# Patient Record
Sex: Female | Born: 1970 | Race: White | Hispanic: No | Marital: Married
Health system: Southern US, Community
[De-identification: ages and names within clinical notes are randomized; demographics above are authoritative.]

---

## 2005-02-17 ENCOUNTER — Encounter (HOSPITAL_COMMUNITY): Admission: RE | Admit: 2005-02-17 | Discharge: 2005-03-19 | Payer: Self-pay | Admitting: Endocrinology

## 2005-03-06 ENCOUNTER — Ambulatory Visit (HOSPITAL_COMMUNITY): Admission: RE | Admit: 2005-03-06 | Discharge: 2005-03-06 | Payer: Self-pay | Admitting: *Deleted

## 2005-03-30 ENCOUNTER — Encounter: Admission: RE | Admit: 2005-03-30 | Discharge: 2005-03-30 | Payer: Self-pay | Admitting: Neurology

## 2007-03-18 ENCOUNTER — Ambulatory Visit (HOSPITAL_COMMUNITY): Admission: RE | Admit: 2007-03-18 | Discharge: 2007-03-18 | Payer: Self-pay | Admitting: Orthopedic Surgery

## 2018-04-30 ENCOUNTER — Other Ambulatory Visit: Payer: Self-pay | Admitting: Otolaryngology

## 2018-04-30 ENCOUNTER — Other Ambulatory Visit (INDEPENDENT_AMBULATORY_CARE_PROVIDER_SITE_OTHER): Payer: Self-pay | Admitting: Otolaryngology

## 2018-04-30 DIAGNOSIS — H93A2 Pulsatile tinnitus, left ear: Secondary | ICD-10-CM

## 2018-05-21 ENCOUNTER — Ambulatory Visit
Admission: RE | Admit: 2018-05-21 | Discharge: 2018-05-21 | Disposition: A | Discharge status: Home / Self Care | Payer: BC Managed Care – PPO | Source: Ambulatory Visit | Attending: Otolaryngology | Admitting: Otolaryngology

## 2018-05-21 DIAGNOSIS — H93A2 Pulsatile tinnitus, left ear: Secondary | ICD-10-CM

## 2018-05-21 MED ORDER — GADOBENATE DIMEGLUMINE 529 MG/ML IV SOLN
15.0000 mL | Freq: Once | INTRAVENOUS | Status: AC | PRN
  Administered 2018-05-21: 15 mL via INTRAVENOUS

## 2019-10-15 IMAGING — MR MR HEAD WO/W CM
11 of 12 series · 37 of 48 positions shown · IV contrast (15 ML MULTIHANCE)
Comparison: 03/30/2005. Report from examination 03/16/2011, actual
images not available.

CLINICAL DATA: Headache. Abnormal sounds in the left ear. Mass
inferior to the ear.

EXAM:
MRI HEAD WITHOUT AND WITH CONTRAST
TECHNIQUE: Multiplanar, multiecho pulse sequences of the brain and surrounding
structures were obtained without and with intravenous contrast.
CONTRAST:  15mL MULTIHANCE GADOBENATE DIMEGLUMINE 529 MG/ML IV SOLN

[Series 2: T1 · sagittal · 5.0mm · 0.45mm/px · 3 of 21 slices shown (1 of 4)]
[im 1/21]
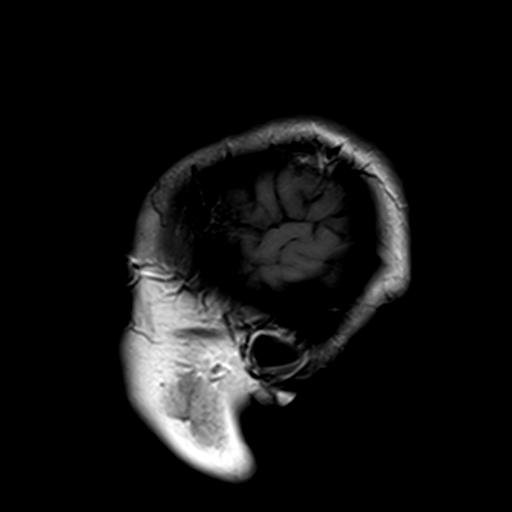
[im 11/21]
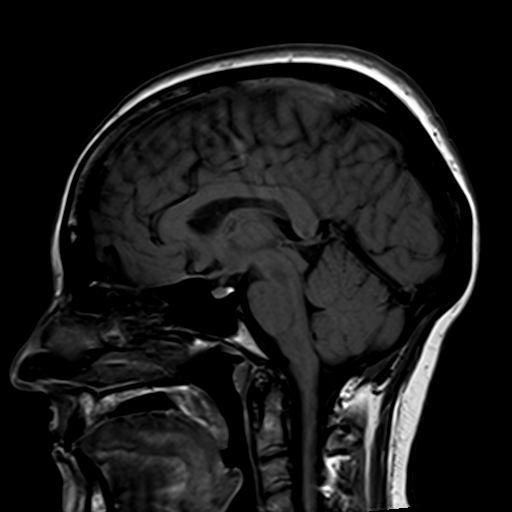
[im 21/21]
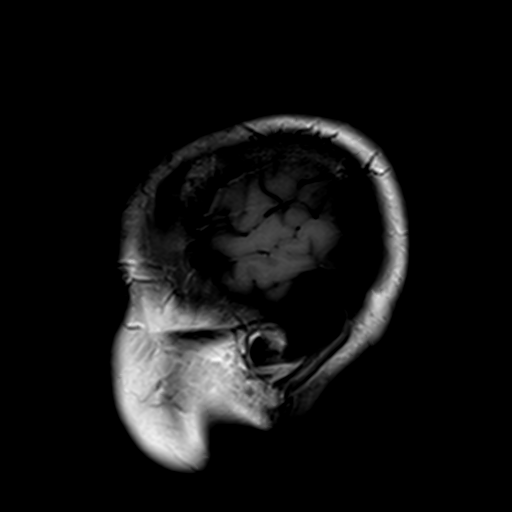

[Series 3: ep2d_diff_(id)_trace · axial · 3.0mm · 1.80mm/px · z∈[-47,+100]mm · 8 of 99 slices shown]
[im 1/99]
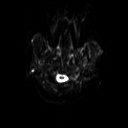
[im 11/99]
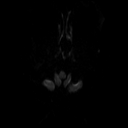
[im 33/99]
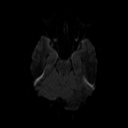
[im 44/99]
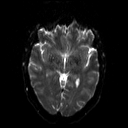
[im 55/99]
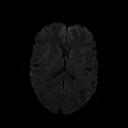
[im 66/99]
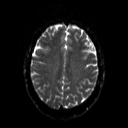
[im 88/99]
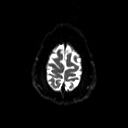
[im 99/99]
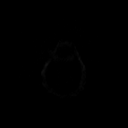

[Series 4: ep2d_diff_(id)_trace_adc · axial · 3.0mm · 1.80mm/px · z∈[-47,+100]mm · 5 of 50 slices shown]
[im 1/50]
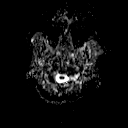
[im 13/50]
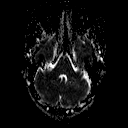
[im 25/50]
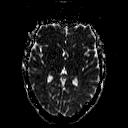
[im 37/50]
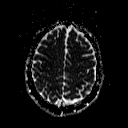
[im 50/50]
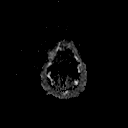

[Series 5: t2_tse_tra_512 · axial · 5.0mm · 0.72mm/px · z∈[-61,+95]mm · 3 of 26 slices shown]
[im 1/26]
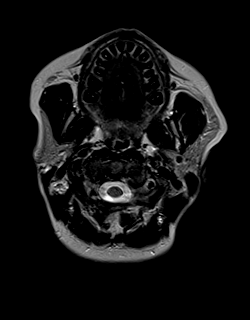
[im 13/26]
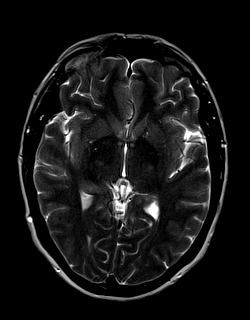
[im 26/26]
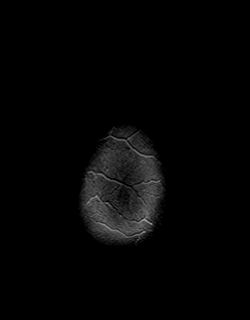

[Series 7: swi_images · axial · 2.0mm · 0.90mm/px · z∈[-62,+96]mm · 8 of 80 slices shown]
[im 1/80]
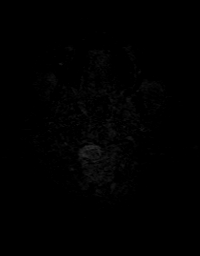
[im 12/80]
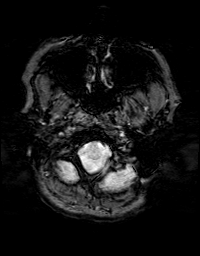
[im 23/80]
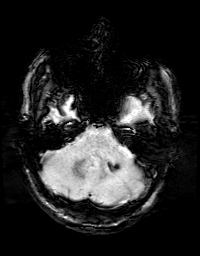
[im 34/80]
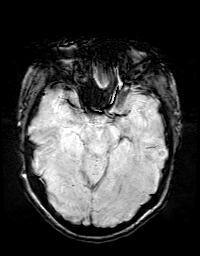
[im 46/80]
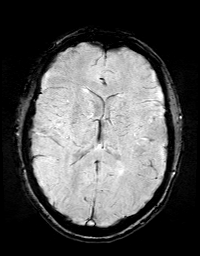
[im 57/80]
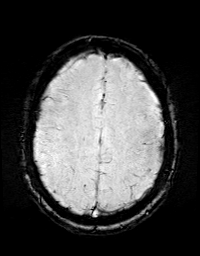
[im 68/80]
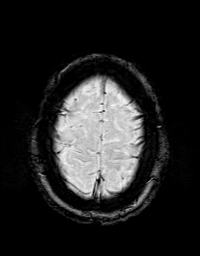
[im 80/80]
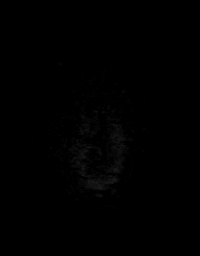

[Series 8: FLAIR · axial · 3.0mm · 0.43mm/px · z∈[-61,+95]mm · 3 of 27 slices shown]
[im 1/27]
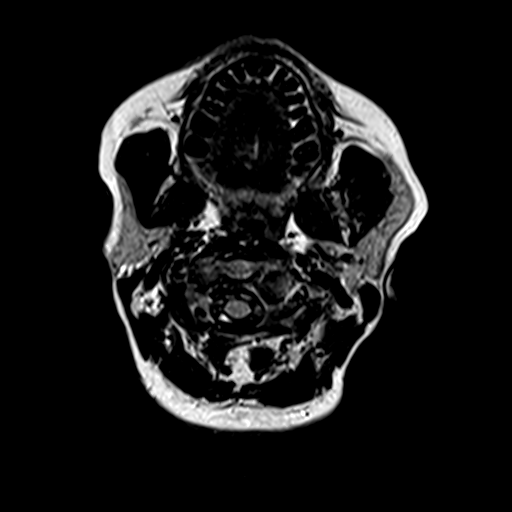
[im 14/27]
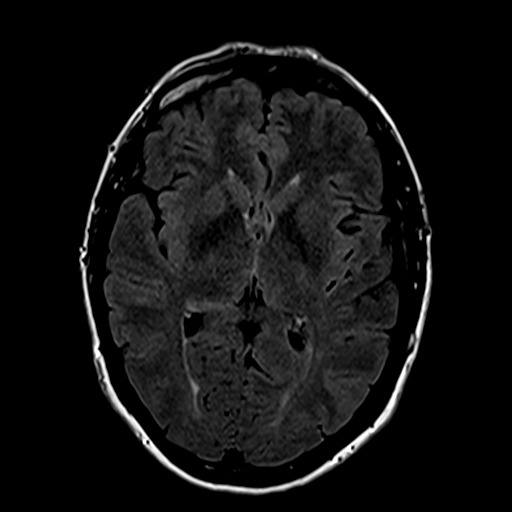
[im 27/27]
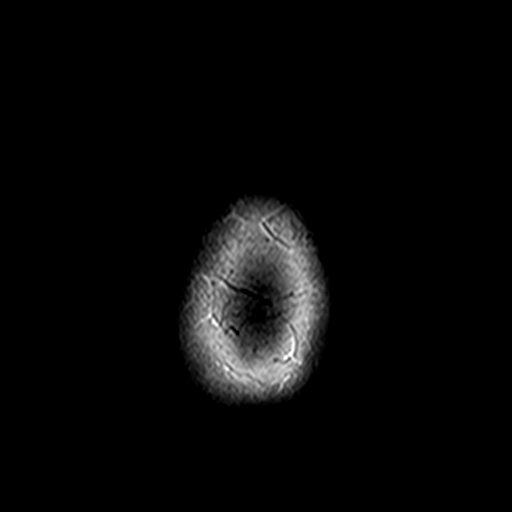

[Series 9: T1 · coronal · 3.0mm · 0.35mm/px · 1 of 13 slices shown (2 of 4)]
[im 1/13]
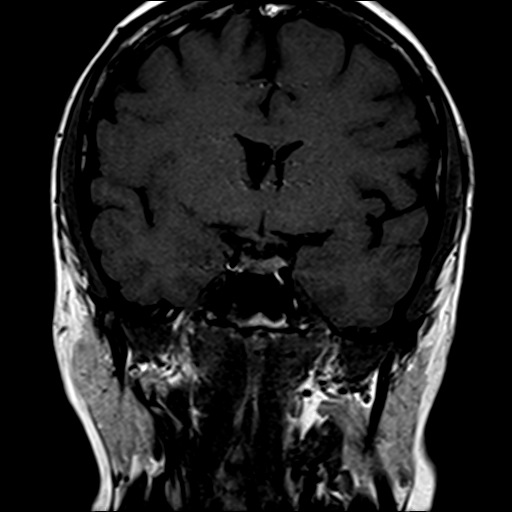

[Series 10: T1 · axial · 3.0mm · 0.35mm/px · 1 of 11 slices shown (3 of 4)]
[im 1/11]
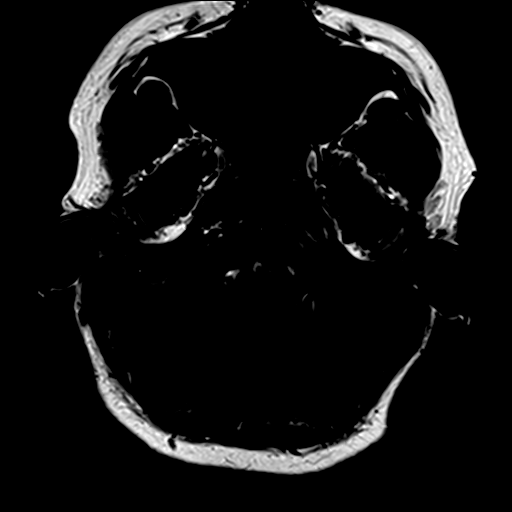

[Series 11: bSSFP · axial · 0.7mm · 0.28mm/px · z∈[-30,-10]mm · 3 of 44 slices shown]
[im 1/44]
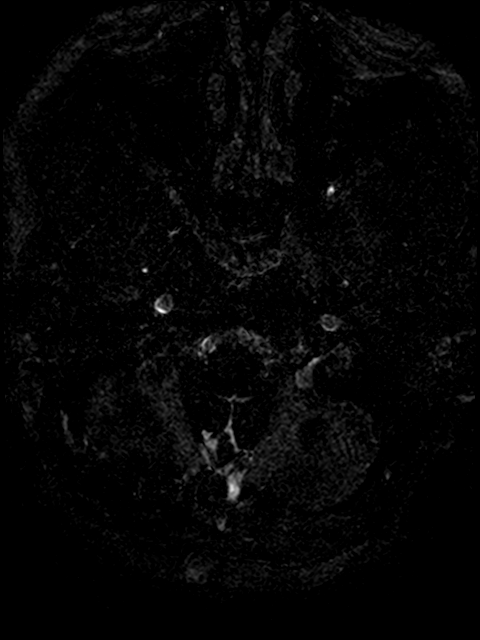
[im 15/44]
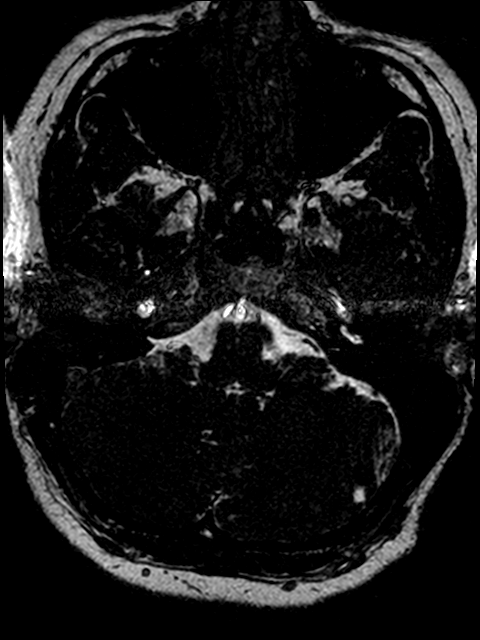
[im 29/44]
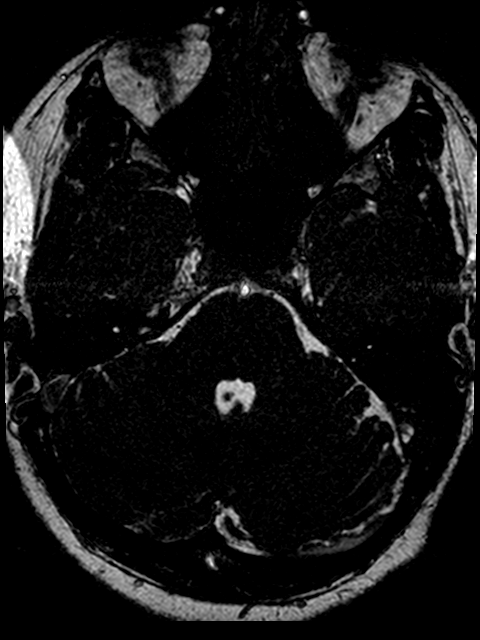

[Series 12: T1 · coronal · 3.0mm · 0.35mm/px · 1 of 13 slices shown (4 of 4)]
[im 1/13]
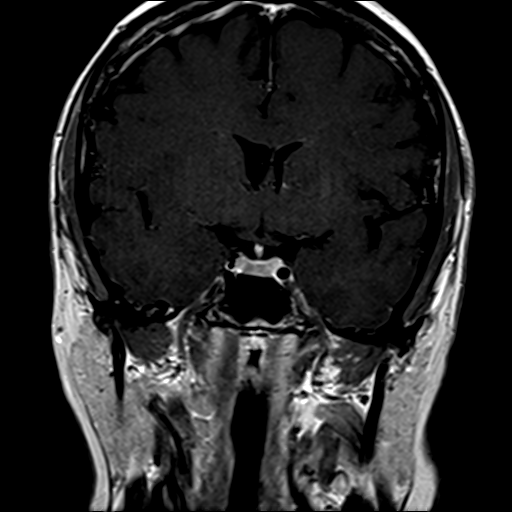

[Series 13: T1 post-contrast · axial · 3.0mm · 0.35mm/px · 1 of 11 slices shown]
[im 1/11]
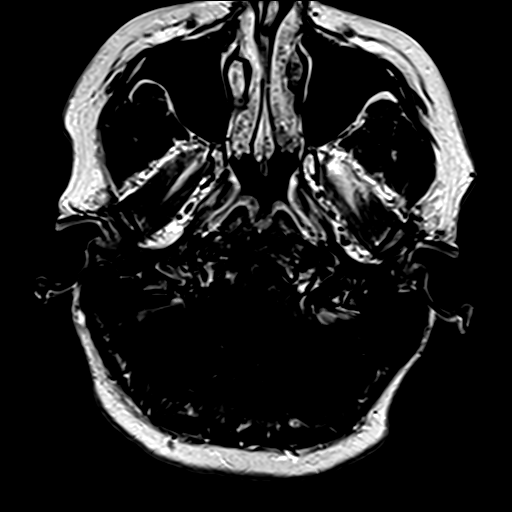

[37 of 48 positions shown; findings below may reference images not displayed]

FINDINGS: Brain: Diffusion imaging does not show any acute or subacute
infarction. The brainstem is normal. Within the left cerebellar
hemisphere and medial portion of the medial cerebellar peduncle on
the left, there is an approximately 14-16 mm region of low level
increased T2 and FLAIR signal, susceptibility artifact, very
low-grade enhancement, and central vascular structure or scar most
consistent with a chronic developmental venous anomaly. This does
not appear significantly changed or progressed when compared to the
study of 2005. If anything, it may measure a mm or 2 smaller.
Therefore, this does not represent any sort of growing tumor or
high-grade vascular malformation. Cerebral hemispheres are normal
except for low level T2 and FLAIR signal within the deep white
matter adjacent to the atria of the lateral ventricles, unchanged
and not likely to be significant. No cortical abnormality. No mass
lesion, acute hemorrhage, hydrocephalus or extra-axial collection.
No evidence of vestibular schwannoma or temporal bone abnormality.

Vascular: Major arterial and venous structures at the base of the
brain show flow. Common arachnoid granulations within the transverse
sinus on the left.

Skull and upper cervical spine: Negative

Sinuses/Orbits: Clear/normal

Other: None. I do not see any evidence of mass underneath the ear as
described in the clinical history.
IMPRESSION: No acute finding. No apparent change since the study of 2005 or the
report of 8908. Probable developmental venous anomaly at the
junction of the left cerebellar hemisphere and medial aspect of the
middle cerebellar peduncle. This does not show any enlargement or
worrisome change since 2005. If anything, it actually [DATE]
or 2 mm smaller, suggesting that there could have been some mild
swelling at the time of the scan of 2005. There is susceptibility
artifact within the lesion consistent with chronic microhemorrhage.
No evidence of acute or large hemorrhage. It is not clear that this
would relate to the patient's clinical presentation. No large or
medium size arterial or venous abnormality seen to explain the
clinical presentation.

## 2022-11-23 ENCOUNTER — Emergency Department (HOSPITAL_COMMUNITY): Payer: BC Managed Care – PPO

## 2022-11-23 ENCOUNTER — Emergency Department (HOSPITAL_COMMUNITY)
Admission: EM | Admit: 2022-11-23 | Discharge: 2022-11-23 | Disposition: A | Discharge status: Home / Self Care | Payer: BC Managed Care – PPO | Attending: Emergency Medicine | Admitting: Emergency Medicine

## 2022-11-23 ENCOUNTER — Other Ambulatory Visit: Payer: Self-pay

## 2022-11-23 ENCOUNTER — Emergency Department (HOSPITAL_BASED_OUTPATIENT_CLINIC_OR_DEPARTMENT_OTHER)
Admission: EM | Admit: 2022-11-23 | Discharge: 2022-11-23 | Discharge status: Absent without leave | Payer: BC Managed Care – PPO | Source: Home / Self Care

## 2022-11-23 DIAGNOSIS — M25561 Pain in right knee: Secondary | ICD-10-CM

## 2022-11-23 MED ORDER — HYDROCODONE-ACETAMINOPHEN 5-325 MG PO TABS: 2.0000 | ORAL_TABLET | ORAL | 0 refills | Status: AC | PRN

## 2022-11-23 NOTE — ED Provider Notes (Signed)
[place] EMERGENCY DEPARTMENT AT [facility] Provider Note   CSN: [id number] Arrival date & time: 11/23/22  1604     History  Chief Complaint  Patient presents with   rt knee pain    Chelsey Suarez is a 52 y.o. female.  52 year old female with prior medical history as detailed below presents for evaluation.  Patient reports that she injured her right knee 2 days ago.  She reports that she was at home and then felt that her right kneecap moved upward and this caused severe pain.  Her husband was able to help her and the kneecap relocated itself.  She subsequently saw [facility] who performed x-rays and told her that she had no fracture.  She was given a soft knee splint.  Today as she was performing several errands throughout the course of the day prior to a upcoming beach trip she developed increased pain to the lateral aspect of the right knee.  She reports increased pain with movement.  She took some ibuprofen prior to arrival.  She is primarily requesting an MRI of her knee.  She is here at the [facility] long ED as MRI capability until 7 PM.  She feels that an MRI would help her have clarity as to what is causing her pain.  In fact, she did not leave on time this afternoon to get down to the beach for her weeklong beach vacation as has been planned.  Does report having follow-up already arranged with [facility] in approximately a week and a half.  The history is provided by the patient and medical records.       Home Medications Prior to Admission medications   Not on File      Allergies    Patient has no known allergies.    Review of Systems   Review of Systems  All other systems reviewed and are negative.   Physical Exam Updated Vital Signs BP 128/75 (BP Location: Left Arm)   Pulse 70   Temp 98 F (36.7 C) (Oral)   Resp 18   Ht 5\' 3"  (1.6 m)   Wt 74.4 kg   SpO2 98%   BMI 29.05 kg/m  Physical Exam Vitals and nursing note reviewed.   Constitutional:      General: She is not in acute distress.    Appearance: Normal appearance. She is well-developed.  HENT:     Head: Normocephalic and atraumatic.  Eyes:     Conjunctiva/sclera: Conjunctivae normal.     Pupils: Pupils are equal, round, and reactive to light.  Cardiovascular:     Rate and Rhythm: Normal rate and regular rhythm.     Heart sounds: Normal heart sounds.  Pulmonary:     Effort: Pulmonary effort is normal. No respiratory distress.     Breath sounds: Normal breath sounds.  Abdominal:     General: There is no distension.     Palpations: Abdomen is soft.     Tenderness: There is no abdominal tenderness.  Musculoskeletal:        General: No deformity. Normal range of motion.     Cervical back: Normal range of motion and neck supple.  Skin:    General: Skin is warm and dry.  Neurological:     General: No focal deficit present.     Mental Status: She is alert and oriented to person, place, and time.     ED Results / Procedures / Treatments   Labs (all labs ordered are listed,  but only abnormal results are displayed) Labs Reviewed - No data to display  EKG None  Radiology No results found.  Procedures Procedures    Medications Ordered in ED Medications - No data to display  ED Course/ Medical Decision Making/ A&P                             Medical Decision Making Amount and/or Complexity of Data Reviewed Radiology: ordered.  Risk Prescription drug management.    Medical Screen Complete  This patient presented to the ED with complaint of right knee pain.  This complaint involves an extensive number of treatment options. The initial differential diagnosis includes, but is not limited to, fracture versus strain versus sprain, etc.  This presentation is: Acute, Self-Limited, Previously Undiagnosed, and Uncertain Prognosis  Patient with complaint of acute knee pain that began several days ago.  Patient has already established care  with [facility] in the outpatient setting.  Today she reports acute worsening of her pain.  She denies any specific inciting injury today.  She is using the soft knee brace that was supplied by [facility].  She reports taking some ibuprofen just prior to coming in today for evaluation.  Patient did request MRI imaging of the knee.  MRI tech informs this provider that at this point in the afternoon (5pm) they would not be able to get to the patient's knee prior to loss of technician coverage on this Sunday afternoon.  X-ray and CT imaging obtained here in the ED.  CT imaging did reveal loose articular body and small effusion with associated significant degenerative changes.  Imaging results discussed extensively with the patient and her husband.  They have plan to follow-up closely with her already established orthopedic care provider tomorrow.  Patient provided with knee immobilizer, crutches, pain medication if required.   Additional history obtained:  External records from outside sources obtained and reviewed including prior ED visits and prior Inpatient records.    Imaging Studies ordered:  I ordered imaging studies including XR, CT  I independently visualized and interpreted obtained imaging which showed degenerative changes, loose articular body in right knee joint I agree with the radiologist interpretation.   Problem List / ED Course:  Right knee pain   Reevaluation:  After the interventions noted above, I reevaluated the patient and found that they have: improved   Disposition:  After consideration of the diagnostic results and the patients response to treatment, I feel that the patent would benefit from close outpatient follow-up.          Final Clinical Impression(s) / ED Diagnoses Final diagnoses:  Acute pain of right knee    Rx / DC Orders ED Discharge Orders          Ordered    HYDROcodone-acetaminophen (NORCO/VICODIN) 5-325 MG tablet  Every 4  hours PRN        07 /07/24 1829              Wynetta Fines, MD 11/23/22 [phone]

## 2022-11-23 NOTE — ED Notes (Signed)
Ortho tech states this knee immobilizer is something we would have to apply.

## 2022-11-23 NOTE — ED Triage Notes (Signed)
Pt reports new onset rt knee pain today. X2 days ago, pt relates dislocating her knee cap, has had a brace in place since. Today she describes making a slight movement of her right foot leading to a shooting pain on lateral side of her rt knee. Now having pain with movement and bearing weight.

## 2022-11-23 NOTE — Discharge Instructions (Signed)
Return for any problem.  As discussed, CT imaging revealed evidence of "loose articular body" in your right knee joint.  Close follow-up tomorrow with your already established orthopedic care providers at [facility] is important.  Use crutches and knee immobilizer and splint as instructed for comfort.
